# Patient Record
Sex: Male | Born: 1990 | Race: Black or African American | Hispanic: No | Marital: Single | Smoking: Never smoker
Health system: Southern US, Community
[De-identification: ages and names within clinical notes are randomized; demographics above are authoritative.]

## PROBLEM LIST (undated history)

## (undated) DIAGNOSIS — K219 Gastro-esophageal reflux disease without esophagitis: Secondary | ICD-10-CM

## (undated) DIAGNOSIS — D649 Anemia, unspecified: Secondary | ICD-10-CM

## (undated) HISTORY — PX: OTHER SURGICAL HISTORY: SHX169

## (undated) HISTORY — DX: Gastro-esophageal reflux disease without esophagitis: K21.9

## (undated) HISTORY — DX: Anemia, unspecified: D64.9

## (undated) HISTORY — PX: THROAT SURGERY: SHX803

---
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
## 2009-03-01 ENCOUNTER — Ambulatory Visit (HOSPITAL_COMMUNITY): Admission: RE | Admit: 2009-03-01 | Discharge: 2009-03-01 | Payer: Self-pay | Admitting: Family Medicine

## 2010-08-26 NOTE — H&P (Signed)
Derrick Lewis, [name] NO.:  [id number]   MEDICAL RECORD NO.:  [id number]                   PATIENT TYPE:  INP   LOCATION:  0203                                 FACILITY:  [facility]   PHYSICIAN:  Carolanne Grumbling, M.D.                 DATE OF BIRTH:  1991/03/24   DATE OF ADMISSION:  01/29/2003  DATE OF DISCHARGE:                         PSYCHIATRIC ADMISSION ASSESSMENT   CHIEF COMPLAINT:  Efstathios was just discharged from this hospital two days  prior to the readmit.  He had continued the same behaviors on this  admission, banging his head on his desk, threatening to stab himself at  school, running away from the school after he threatened to kill himself.   PATIENT IDENTIFICATION:  Derrick Lewis is an almost 20 year old boy.   HISTORY OF PRESENT ILLNESS:  [name] basically does not give any reasons for  his being so depressed.  He shrugs, does not know why, he just feels  depressed and suicidal.  His aunt reported that he has been depressed since  his grandmother died a year ago.  She was the person who reared him.  His  mother has a serious drug problem.  After the grandmother died, he has been  living with his aunt but he has remained depressed and quite disturbed.   PAST PSYCHIATRIC HISTORY:  He was just discharged from this hospital two  days prior to the readmit.  He sees Nawar M. Alnaquib, M.D., outpatient and  [facility] for outpatient therapy.   SUBSTANCE ABUSE HISTORY:  Drug, alcohol, and legal issues: None were  reported.   PAST MEDICAL HISTORY:  He has gastroesophageal reflux disorder.   MEDICATIONS:  1. Zoloft.  2. Risperdal.  3. Prevacid.   ALLERGIES:  He has no known allergies to medicines.   FAMILY, SCHOOL, AND SOCIAL ISSUES:  He was reared by his grandmother who  died a year ago.  His aunt is now his guardian.  His mother has serious drug  problems.  His father is apparently out of the picture.  No abuse was  reported, according to  Skylier's story.  He is in the seventh grade.  He did  not want to leave the hospital the last time.  He reportedly feels  comfortable in the hospital but he was encouraged to leave rather than  become increasingly dependent on the hospital.   MENTAL STATUS EXAM:  Mental status at the time of the initial evaluation  revealed an alert, oriented young man who came to the interview willingly  and was cooperative.  He was appropriately dressed and groomed.  He looked  sad and said he felt depressed.  He admitted to suicidal thoughts and  threats and still had suicidal thoughts at the time of admission.  There was  no evidence of any thought disorder or other psychosis.  Short and long-term  memory were intact as measured by  his ability to recall recent and remote  events in his own life.  Insight was lacking.  Intellectual functioning  seemed at least average.  Concentration was adequate for a one-to-one  interview.   ADMISSION DIAGNOSES:   AXIS I:  Major depressive disorder, recurrent, moderate.   AXIS II:  Deferred.   AXIS III:  Gastroesophageal reflux disease.   AXIS IV:  Severe.   AXIS V:  45/65   ASSETS AND STRENGTHS:  [name] is cooperative.   INITIAL PLAN OF CARE:  The plan is to return him home once again with the  hopes that he is taking some responsibility for his behavior and once again  denying any suicidal thoughts.  Medications will be continued.  Beverly Milch, M.D., will be the attending.   ESTIMATED LENGTH OF HOSPITALIZATION:  About five days.                                               Carolanne Grumbling, M.D.    GT/MEDQ  D:  01/30/2003  T:  01/30/2003  Job:  [id number]

## 2010-08-26 NOTE — Discharge Summary (Signed)
Derrick Lewis, FERRALL                           ACCOUNT NO.:  [id number]   MEDICAL RECORD NO.:  [id number]                   PATIENT TYPE:  INP   LOCATION:  0203                                 FACILITY:  [facility]   PHYSICIAN:  Beverly Milch, MD                  DATE OF BIRTH:  08-Apr-1991   DATE OF ADMISSION:  01/29/2003  DATE OF DISCHARGE:  02/04/2003                                 DISCHARGE SUMMARY   IDENTIFYING DATA:  A 97[age]47/20-year-old male, 7th grade student at [employer], was readmitted by Dr. Carolanne Grumbling two days after discharge  from week-long inpatient treatment for head banging, threatening to stab  himself at school, running away from school, and relative emotional  breakdown.  For full details, please see the typed admission assessment.   SYNOPSIS OF PRESENT ILLNESS:  The patient had been a reasonably good student  and behaviorally-controlled child until the last year or so.  Following the  death of his grandmother and the abandonment by his mother who was using  crack at the time of grandmother's funeral, the patient has become more  angry and dysphorically refusing of communication.  Aunt has been attempting  to help regulate his behavior, as well as to provide support and love.  The  patient has been in therapy with [facility] worker [name], who works on the  grief over grandmother's death.  Dr. [name] has been prescribing  pharmacotherapy for the patient initially with Zoloft 50 mg daily and  Risperdal 1 mg at bedtime.  During his last hospitalization, the patient's  Risperdal was increased to 2 mg nightly and Zoloft to 150 mg every morning.  The aunt wondered if the patient's head banging was due to the Risperdal and  [name] seemed to wonder the same.  Despite phone interventions, the patient  continued to escalate in his behavior and had predicated at the time of  hospital discharge that he would get in trouble though he had resolved his  statements that he  would hurt himself.  These had apparently re-emerged.  The patient also takes Prevacid for gastroesophageal reflux.  Father is out  of the picture.  The patient does not want to return to school but does find  that his interaction at school, as well as with his aunt, are now mobilizing  verbal processing of his loss and despair.  This is his only area of  progress thus far.   INITIAL MENTAL STATUS EXAMINATION:  Dr. [name] noted the patient looked sad  and said he felt depressed.  He acknowledged suicide thoughts and threats.  He was cognitively intact and had no psychotic or manic symptoms.  He lacked  insight but concentration was adequate.   LABORATORY FINDINGS:  During his last hospitalization, the patient had an  elevated alkaline phosphatase at 522 with upper limit of normal 499, likely  due to  active growth.  His MCV was borderline low at 74.2 and hemoglobin at  11.3 with hematocrit 33.9.  His other studies during that hospitalization  were normal.  At the closure of the current hospitalization, comprehensive  metabolic panel was checked relative to the patient's ongoing  pharmacotherapy.  His basic metabolic panel was normal except glucose 105  with reference range 70-99.  His fasting glucose had been 85 and normal his  last hospitalization one week before.  His creatinine was normal at 0.7 and  calcium at 8.9.  Hepatic function panel was normal with SGOT 22 and SGPT of  15 with albumin borderline low at 3.4 with reference range 3.5-5.2.  The  patient had a free T4 which was normal at 0.92 with reference range 0.89-1.8  and his TSH had been normal at 1.118 last admission.  His T3 uptake was  normal at 36.7.  His a.m. cortisol was normal at 15.6.  These studies were  performed because the patient had significant elevation of blood pressure  and heart rate when he stood with blood pressure running upper limit of  normal in the standing position though normal supine.   HOSPITAL  COURSE AND TREATMENT:  General medical exam by Sallye Lat, P.A.-  C. noted no significant injury from head banging.  The patient reported in  his general medical exam that he did not want to be with his aunt.  He was  concluded to be healthy and had no medical consequences of head banging.  His vital signs on admission included weight of 112 pounds with blood  pressure 129/87 and heart rate of 94.  During his hospital stay, he had  supine blood pressures in the 110/70 range while standing blood pressures  were in the 130/84 range.  His maximum standing blood pressure was 137/89 at  which time his sitting blood pressure had been 106/58.  On the day of  discharge, his supine blood pressure was 101/63 with heart rate of 84 and  his standing blood pressure was 127/87 with heart rate of 123.  The  patient's medications were not further changed.  His Risperdal had been  returned to 1 mg at bedtime just prior to readmission by phone.  His Zoloft  was continued at 150 mg every morning.  I did discuss with the patient and  aunt at length the options for alternative pharmacotherapy considering that  he was not responding over time to Zoloft as rapidly as desired; however,  through the course of the hospital stay, the patient did begin opening up  more and showing more positive affect.  He began to accept that he would  need to be with his aunt and return to school but he expressed a strong  desire to spend more time with his maternal grandfather whom he thinks has  gotten over the death of the maternal grandmother.  He states that the  maternal grandfather drives a truck but he thinks that the aunt disapproves  of him.  The patient did work on how he might approach this need with the  aunt and they had a family conference and therapy session immediately prior  to discharge.  The patient was still hesitant to open up and discuss all of his thoughts and feelings but he was doing better at this by the  time of  discharge.  He had no self-injury during the hospital stay.  The patient was  not requiring any p.r.n. medication for two days prior to  discharge though  he did require 5 mg of olanzapine on 02/02/03 as a p.r.n.  The patient was  upset after aunt's visit at that time but continued to work on resolution,  understanding, and compromise.  He was discharged in improved condition free  of suicide ideation and with a conviction that he was not going to get in  trouble this time.   FINAL DIAGNOSES:   AXIS I:  1. Major depression, single episode, moderate severity with melancholic     features.  2. Dysthymic disorder, early onset, severe with atypical features.  3. Oppositional defiant disorder.  4. Parent/child problem.  5. Other specified family circumstances.  6. Other interpersonal problem.   AXIS II:  Diagnosis deferred.   AXIS III:  1. Gastroesophageal reflux.  2. Borderline microcytosis and hypoalbuminemia.  3. Single episode of borderline elevated glucose.  4. Orthostatic borderline elevation of blood pressure and heart rate.   AXIS IV:  Stressors:  Family--severe, predominately acute and chronic;  school phase of life--moderate, predominately acute.   AXIS V:  GAF at the time of admission was 45 with highest in the last year  estimated at 72 and discharge GAF was 54.   PLAN:  The patient is discharged home on Zoloft 100-mg tablets using 1-1/2  tablets or 150 mg every morning having a supply at home.  His Risperdal is  back at 1 mg every bedtime and he has a supply at home.  He continues  Prevacid every morning for reflux though he was using Protonix 40 mg every  morning in the hospital for formulary reasons.  The patient will have  individual and family therapy with Jolly Mango 02/09/03 at 1530 at [facility].  He will see Dr. [name] 02/24/03 at 1515.  He has  Mercie Eon at [employer] as a Theatre stage manager for grief.  He hopes to spend more  time with  his maternal grandfather if aunt will allow and approve.  Crisis  and safety  plans are established if needed and they understand side effects, risks, and  proper use of medications including FDA scientific conclusions about all  antidepressants having a 1 in 50 chance of producing suicide-related side  effects in children less than 18.                                               Beverly Milch, MD    GJ/MEDQ  D:  02/05/2003  T:  02/05/2003  Job:  [id number]   cc:   Jolly Mango  [facility]  [address] [place] [address]   Laney Pastor. Alnaquib, M.D.  Fax: [phone]

## 2010-08-26 NOTE — Discharge Summary (Signed)
NAMEGIAVONNI, Derrick Lewis                           ACCOUNT NO.:  [id number]   MEDICAL RECORD NO.:  [id number]                   PATIENT TYPE:  INP   LOCATION:  0605                                 FACILITY:  [facility]   PHYSICIAN:  Derrick Milch, MD                  DATE OF BIRTH:  12/20/1990   DATE OF ADMISSION:  01/17/2003  DATE OF DISCHARGE:  01/27/2003                                 DISCHARGE SUMMARY   IDENTIFYING DATA:  This 20 year old male seventh grade student at  [employer] was admitted voluntarily from the Access Department at  [facility] where the patient and aunt presented as patient of  Derrick Lewis, M.D.  Despite low dose Risperdal and Zoloft  pharmacotherapy, the patient the patient has continued to become more  aggressive and resistant at school and home.  His guardian aunt brings him  for emergency intervention after the patient grabbed knives in the home  kitchen, threatening to harm himself after attempting to run away after aunt  intervened behaviorally with discipline for the patient's disruption of  school and decline in grades as she was called by the teacher.  For full  details, please see the typed history and physical.   HISTORY OF PRESENT ILLNESS:  The patient indicates that he is progressively  bored and unmotivated with school and remainder of life.  He has been  getting in more and more trouble and exhibiting resistive affective  aggression when confronted.  He experienced the death of his maternal  grandmother and the abandonment by his mother who was using crack at the  time of grandmother's funeral.  The aunt has been diligent in attempting to  care for the patient since grandmother died in 13-Sep-2001.  Mother is antisocial  as well as having drug dependence.  The patient is on Prevacid for  gastroesophageal reflux though he skips a dose sometimes as prescribed by  Derrick Lewis, M.D., at [facility].  The patient has a  sustained  pattern of dysphoria that has been difficult to assess intrapsychically as  the patient is acting out more and clamming up to others' attempts to talk  out solutions with him.  The patient has been more oppositional over the  last six months.  He has had no hypomanic symptoms.   INITIAL MENTAL STATUS EXAM:  Neurological exam was intact.  The patient had  moderate to severe dysphoria overtly initially, though he would not verbally  clarify or quantitate his intrapsychic symptoms.  He has intense neurotic  conflict and acts out behaviorally more than he will talk out problems or  solutions.  He is intellectually capable and has affective resources to work  in therapy.  He has a Radiographer, therapeutic, Clinical cytogeneticist, who has been helping with the  patient's grief over grandmother's death.  The patient has capacity for  insight and judgment  though he disengages these as he establishes mounting  conflicts with school and family.  He is capable of A and B school work and  rewarding relations in the past but is now disengaged.   LABORATORY FINDINGS:  Urinalysis revealed ketones of 40 and total protein 30  but was otherwise normal including 0-2 wbc's and 0-2 rbc's with specific  gravity 1.030.  This was felt to be representative of volume contraction  effects.  Hepatic function panel was normal except albumin borderline low at  3.4 with reference range 3.5-5.2.  AST was normal at 20 and ALT 10.  His  alkaline phosphatase was elevated at 522 with upper limit of normal 499  though this was felt to be associated with active growth.  Basic metabolic  panel was normal with sodium 142, potassium 3.8, glucose 85, creatinine 0.7,  and calcium 8.6.  CBC was normal with white count 7100, hemoglobin 11.3,  hematocrit 33.9, and platelet count 254,000, though MCV was borderline low  at 74.2 with reference range 78-92.  He did have 66% neutrophils, 22%  lymphocytes, and 22% monocytes.  His GGT was normal at 13  and TSH at 1.118.   [facility] COURSE AND TREATMENT:  General medical exam by Derrick Lewis,  P.A.-C., revealed no abnormalities.  The patient was generally healthy,  reporting having some sutures of the left eyebrow in the past and having no  medication allergies.  The patient reported having friends but not  understanding his schoolwork.  He noted that his 20 year old sister steals  and that he has conflict with his aunt.  Biological mother is a crack  addict.  The patient wrote and excellent letter to his mother clarifying his  anger over her coming in and out of his life and using drugs when  grandmother was dying.  He reviewed the promises mother makes him and then  recants as she uses all of the money for drugs.  He noted that he would not  stay with her unless she turned her life around.  He noted that she must  stop stealing and trying to burn people's houses down and running from the  police.  He did sign his letter with love.  The patient worked on all issues  during Lewis stay.  His hospitalization was extended by his lack of  opening up about his mood and the need to continue to attempt to interpret  for him ways to change and the benefits of doing so as well as the  consequences of not.  He reported at various times thoughts of harming  himself, auditory hallucinations, and angry refusal.  He banged his forehead  and his occiput on the wall at one time and hit his right knuckles on the  wall in anger over unit discipline for his breaking the rules.  He gradually  worked through such.  His termination work noted an exacerbation in these  symptoms again.  The patient seemed to be stating simultaneously that he was  ready for discharge but that he was angry and undermining and resisting  discharge.  We repeatedly gave him feedback on his failed coping attempts  and communication efforts.  We prompted successful communication and interventions, particularly on the day of  discharge.  His electrocardiogram  on the day of discharge was normal sinus rhythm, normal EKG with QTc 396  msec on 2 mg Risperdal the evening before.  His Risperdal was titrated up  during the Lewis stay as was  Zoloft.  His QRS was 82 msec and PR was 152  msec.  His rate was 72.  His vital signs remained normal throughout Lewis  stay.  Though he was taunting and threatening at times, even on the day of  discharge, he was able to work through over the two days preceding discharge  a desire to be with his aunt rather than to be at the [facility].  As he  dissipated his acting out, it was hoped that he would be able to return home  without acting out there.  This seemed to be successful in the discharge  proceedings.  The patient's admission weight was 107 pounds and blood  pressure was 131/82 supine and 139/91 standing with heart rate 83.  He  required p.r.n. Zyprexa to sleep the evening prior to discharge despite the  increase in his Risperdal.  He still stated that he awoke at 4:30 or 5:00 in  the morning.  However, he was not felt to have major depression but rather,  dysthymia and oppositional defiant disorder.  His final blood pressure was  127/89 with heart rate 93 on the day prior to discharge and diastolic was 90  with standing but 72 with sitting on the day of discharge.  The patient  concluded that the way he would hurt himself if he did after discharge was  to get in trouble again.  He openly dissipated this in the termination work,  particularly on the day of discharge.  He was tolerating his medications  well at the time of discharge and had no medication-associated or induced  side effects including no suicidal ideation from medication.  He was  discharged in improved condition to his aunt's.   FINAL DIAGNOSES:   AXIS I:  1. Dysthymic disorder, early onset, severe with atypical features.  2. Oppositional defiant disorder.  3. Rule out major depression, single episode,  moderate severity with     melancholic features (provisional diagnosis).  4. Parent-child problem.  5. Other specified family circumstances.  6. Other interpersonal problems.   AXIS II:  Diagnosis deferred.   AXIS III:  1. Gastroesophageal reflux disease.  2. Borderline elevated blood pressure intermittently.  3. Urine ketones and protein likely associated with volume contraction.  4. Borderline microcytosis and hypoalbuminemia.  5. Elevated alkaline phosphatase likely associated with growth.   AXIS IV:  Stressors: Family- severe, predominantly acute and chronic; school  and phase of life- moderate, predominantly acute.   AXIS V:  Global assessment of functioning at the time of admission was 38  with highest in the last year estimated at 72 and global assessment of  functioning at the time of discharge 54.   PLAN:  The patient was discharged on the following medications:  1. Zoloft 100 mg tablet to use one and a half tablets every morning,    quantity #45 with no refill prescribed.  2. Risperdal 2 mg tablet to use one every bedtime, quantity #30 with no     refill prescribed.  3. Prevacid to continue one every morning for reflux, having a supply at     home though in the Lewis we used Protonix 40 mg as the formulary did     not include Prevacid.   The patient will have therapy with Mercie Eon in seven to 10 days with  aunt having to call to schedule.  The patient will see Derrick Lewis,  M.D., February 12, 2003, at 10:15 for medication check.  Apparently, Kriste Basque  is  his [facility] therapist rather than Peggy as I misunderstood the patient's  description of Becky's name.  Diagnoses and medications were explained early  on to aunt who participated in family therapy effectively including in  discharge proceedings.  Crisis and safety plans are formulated if needed.                                               Derrick Milch, MD    GJ/MEDQ  D:  01/27/2003  T:   01/28/2003  Job:  [id number]   cc:   Mercie Eon  [facility]  [address]  [place], [place] [address]   Laney Pastor. Lewis, M.D.  Fax: [phone]
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
## 2012-10-08 ENCOUNTER — Ambulatory Visit: Payer: Self-pay | Admitting: Emergency Medicine

## 2012-10-08 VITALS — BP 100/70 | HR 69 | Temp 98.0°F | Resp 16 | Ht 69.5 in | Wt 145.0 lb

## 2012-10-08 DIAGNOSIS — Z0289 Encounter for other administrative examinations: Secondary | ICD-10-CM

## 2012-10-08 NOTE — Progress Notes (Signed)
[facility] [address], [place] [address] [phone]  Date:  10/08/2012   Name:  Derrick Lewis   DOB:  08/12/1990   MRN:  [id number]  PCP:  No PCP Per Patient    Chief Complaint: Employment Physical   History of Present Illness:  Derrick Lewis is a 22 y.o. very pleasant male patient who presents with the following:  DOT certification  There are no active problems to display for this patient.   History reviewed. No pertinent past medical history.  History reviewed. No pertinent past surgical history.  History  Substance Use Topics  . Smoking status: Never Smoker   . Smokeless tobacco: Not on file  . Alcohol Use: No    History reviewed. No pertinent family history.  No Known Allergies  Medication list has been reviewed and updated.  No current outpatient prescriptions on file prior to visit.   No current facility-administered medications on file prior to visit.    Review of Systems:  As per HPI, otherwise negative.    Physical Examination: Filed Vitals:   10/08/12 1707  BP: 100/70  Pulse: 69  Temp: 98 F (36.7 C)  Resp: 16   Filed Vitals:   10/08/12 1707  Height: 5' 9.5" (1.765 m)  Weight: 145 lb (65.772 kg)   Body mass index is 21.11 kg/(m^2). Ideal Body Weight: Weight in (lb) to have BMI = 25: 171.4  GEN: WDWN, NAD, Non-toxic, A & O x 3 HEENT: Atraumatic, Normocephalic. Neck supple. No masses, No LAD. Ears and Nose: No external deformity. CV: RRR, No M/G/R. No JVD. No thrill. No extra heart sounds. PULM: CTA B, no wheezes, crackles, rhonchi. No retractions. No resp. distress. No accessory muscle use. ABD: S, NT, ND, +BS. No rebound. No HSM. EXTR: No c/c/e NEURO Normal gait.  PSYCH: Normally interactive. Conversant. Not depressed or anxious appearing.  Calm demeanor.    Assessment and Plan: DOT certification Signed,  Phillips Odor, MD

## 2012-10-24 ENCOUNTER — Encounter: Payer: Self-pay | Admitting: Emergency Medicine

## 2014-03-10 ENCOUNTER — Ambulatory Visit (INDEPENDENT_AMBULATORY_CARE_PROVIDER_SITE_OTHER): Payer: Self-pay | Admitting: Physician Assistant

## 2014-03-10 VITALS — BP 102/76 | HR 72 | Temp 98.0°F | Resp 16 | Ht 69.5 in | Wt 153.4 lb

## 2014-03-10 DIAGNOSIS — J029 Acute pharyngitis, unspecified: Secondary | ICD-10-CM

## 2014-03-10 DIAGNOSIS — R0981 Nasal congestion: Secondary | ICD-10-CM

## 2014-03-10 DIAGNOSIS — R05 Cough: Secondary | ICD-10-CM

## 2014-03-10 DIAGNOSIS — J069 Acute upper respiratory infection, unspecified: Secondary | ICD-10-CM

## 2014-03-10 DIAGNOSIS — R059 Cough, unspecified: Secondary | ICD-10-CM

## 2014-03-10 MED ORDER — BENZONATATE 100 MG PO CAPS: 100.0000 mg | ORAL_CAPSULE | Freq: Three times a day (TID) | ORAL | Status: DC | PRN

## 2014-03-10 MED ORDER — GUAIFENESIN ER 1200 MG PO TB12: 1.0000 | ORAL_TABLET | Freq: Two times a day (BID) | ORAL | Status: DC | PRN

## 2014-03-10 MED ORDER — HYDROCODONE-HOMATROPINE 5-1.5 MG/5ML PO SYRP: 5.0000 mL | ORAL_SOLUTION | Freq: Three times a day (TID) | ORAL | Status: DC | PRN

## 2014-03-10 MED ORDER — HYDROCOD POLST-CHLORPHEN POLST 10-8 MG/5ML PO LQCR: 5.0000 mL | Freq: Every evening | ORAL | Status: DC | PRN

## 2014-03-10 MED ORDER — IPRATROPIUM BROMIDE 0.03 % NA SOLN: 2.0000 | Freq: Two times a day (BID) | NASAL | Status: DC

## 2014-03-10 NOTE — Progress Notes (Signed)
    MRN: [id number] DOB: 07/23/1990  Subjective:   Derrick Lewis is a 23 y.o. male presenting for 6 day history of sore throat. Used alka-seltzer with relief. The following day patient started having dry cough intermittently productive with yellow sputum, chest pain with coughing worse at night affecting his sleep, worsening sinus congestion. Trying to drink fluids and rest. Has also been using Mucinex with minimal relief. Denies fevers, sinus pain, rhinorrhea, ear pain, ear drainage, itchy watery eyes, shob, wheezing, abdominal pain, n/v, diarrhea. Denies seasonal allergies, history of asthma, reflux. Has had 1 sick contact, 24 year old daughter a week and a half ago. Patient is also a [employer] architecttruck driver, [place]. Denies smoking, or alcohol use. Denies any other aggravating or relieving factors, no other questions or concerns.  No current medications.  No Known Allergies.  Denies past medical history.  Denies past surgical history.   ROS As in subjective.   Objective:   Vitals: BP 102/76 mmHg  Pulse 72  Temp(Src) 98 F (36.7 C) (Oral)  Resp 16  Ht 5' 9.5" (1.765 m)  Wt 153 lb 6.4 oz (69.582 kg)  BMI 22.34 kg/m2  SpO2 98%  Physical Exam  Constitutional: He is oriented to person, place, and time and well-developed, well-nourished, and in no distress.  HENT:  TM's flat bilaterally, intact, non-tender, no effusions or erythema. Nasal turbinates inflamed, clear rhinorrhea. Postnasal drip present, without oropharyngeal exudates or tonsillar abscesses.  Eyes: Conjunctivae and EOM are normal. Right eye exhibits no discharge. Left eye exhibits no discharge. No scleral icterus.  Neck: Normal range of motion.  Cardiovascular: Normal rate, regular rhythm, normal heart sounds and intact distal pulses.  Exam reveals no gallop and no friction rub.   No murmur heard. Pulmonary/Chest: Effort normal and breath sounds normal. No stridor. No respiratory distress. He has no wheezes.  He has no rales. He exhibits no tenderness.  No dullness to percussion.  Abdominal: Soft. Bowel sounds are normal. There is no tenderness.  Lymphadenopathy:    He has no cervical adenopathy.  Neurological: He is alert and oriented to person, place, and time.  Skin: Skin is warm and dry. No rash noted. He is not diaphoretic. No erythema.   Assessment and Plan :   1. Upper respiratory infection - Likely viral in etiology, advised supportive care - return to clinic if symptoms worsen, fail to improve in the next 2-4 days, otherwise return to clinic as needed  2. Sore throat 3. Cough - Advised to stop cough syrup if he has drowsiness in the morning before driving - benzonatate (TESSALON) 100 MG capsule; Take 1-2 capsules (100-200 mg total) by mouth 3 (three) times daily as needed for cough.  Dispense: 40 capsule; Refill: 0 - HYDROcodone-homatropine (HYCODAN) 5-1.5 MG/5ML syrup; Take 5 mLs by mouth every 8 (eight) hours as needed for cough.  Dispense: 120 mL; Refill: 0  4. Nasal congestion - ipratropium (ATROVENT) 0.03 % nasal spray; Place 2 sprays into both nostrils 2 (two) times daily.  Dispense: 30 mL; Refill: 0 - Guaifenesin (MUCINEX MAXIMUM STRENGTH) 1200 MG TB12; Take 1 tablet (1,200 mg total) by mouth every 12 (twelve) hours as needed.  Dispense: 14 tablet; Refill: 1   Wallis BambergMario Derryck Shahan, PA-C [facility] [phone] 03/10/2014 10:34 AM

## 2014-03-10 NOTE — Progress Notes (Signed)
The patient was discussed with me and I agree with the diagnosis and treatment plan.  

## 2014-03-10 NOTE — Patient Instructions (Signed)
We will treat this as a respiratory viral syndrome. I recommend you rest, drink plenty of fluids, eat light meals including soups. You may use cough syrup for your cough and sore throat, just be aware that it can definitely make you drowsy and sleepy so do not drive or operate any heavy machinery. You may also use ibuprofen over-the-counter for your sore throat. Please let me know if you are not seeing any improvement or get worse in the next 2-4 days.    Upper Respiratory Infection, Adult An upper respiratory infection (URI) is also sometimes known as the common cold. The upper respiratory tract includes the nose, sinuses, throat, trachea, and bronchi. Bronchi are the airways leading to the lungs. Most people improve within 1 week, but symptoms can last up to 2 weeks. A residual cough may last even longer.  CAUSES Many different viruses can infect the tissues lining the upper respiratory tract. The tissues become irritated and inflamed and often become very moist. Mucus production is also common. A cold is contagious. You can easily spread the virus to others by oral contact. This includes kissing, sharing a glass, coughing, or sneezing. Touching your mouth or nose and then touching a surface, which is then touched by another person, can also spread the virus. SYMPTOMS  Symptoms typically develop 1 to 3 days after you come in contact with a cold virus. Symptoms vary from person to person. They may include:  Runny nose.  Sneezing.  Nasal congestion.  Sinus irritation.  Sore throat.  Loss of voice (laryngitis).  Cough.  Fatigue.  Muscle aches.  Loss of appetite.  Headache.  Low-grade fever. DIAGNOSIS  You might diagnose your own cold based on familiar symptoms, since most people get a cold 2 to 3 times a year. Your caregiver can confirm this based on your exam. Most importantly, your caregiver can check that your symptoms are not due to another disease such as strep throat,  sinusitis, pneumonia, asthma, or epiglottitis. Blood tests, throat tests, and X-rays are not necessary to diagnose a common cold, but they may sometimes be helpful in excluding other more serious diseases. Your caregiver will decide if any further tests are required. RISKS AND COMPLICATIONS  You may be at risk for a more severe case of the common cold if you smoke cigarettes, have chronic heart disease (such as heart failure) or lung disease (such as asthma), or if you have a weakened immune system. The very young and very old are also at risk for more serious infections. Bacterial sinusitis, middle ear infections, and bacterial pneumonia can complicate the common cold. The common cold can worsen asthma and chronic obstructive pulmonary disease (COPD). Sometimes, these complications can require emergency medical care and may be life-threatening. PREVENTION  The best way to protect against getting a cold is to practice good hygiene. Avoid oral or hand contact with people with cold symptoms. Wash your hands often if contact occurs. There is no clear evidence that vitamin C, vitamin E, echinacea, or exercise reduces the chance of developing a cold. However, it is always recommended to get plenty of rest and practice good nutrition. TREATMENT  Treatment is directed at relieving symptoms. There is no cure. Antibiotics are not effective, because the infection is caused by a virus, not by bacteria. Treatment may include:  Increased fluid intake. Sports drinks offer valuable electrolytes, sugars, and fluids.  Breathing heated mist or steam (vaporizer or shower).  Eating chicken soup or other clear broths, and maintaining good  nutrition.  Getting plenty of rest.  Using gargles or lozenges for comfort.  Controlling fevers with ibuprofen or acetaminophen as directed by your caregiver.  Increasing usage of your inhaler if you have asthma. Zinc gel and zinc lozenges, taken in the first 24 hours of the common  cold, can shorten the duration and lessen the severity of symptoms. Pain medicines may help with fever, muscle aches, and throat pain. A variety of non-prescription medicines are available to treat congestion and runny nose. Your caregiver can make recommendations and may suggest nasal or lung inhalers for other symptoms.  HOME CARE INSTRUCTIONS   Only take over-the-counter or prescription medicines for pain, discomfort, or fever as directed by your caregiver.  Use a warm mist humidifier or inhale steam from a shower to increase air moisture. This may keep secretions moist and make it easier to breathe.  Drink enough water and fluids to keep your urine clear or pale yellow.  Rest as needed.  Return to work when your temperature has returned to normal or as your caregiver advises. You may need to stay home longer to avoid infecting others. You can also use a face mask and careful hand washing to prevent spread of the virus. SEEK MEDICAL CARE IF:   After the first few days, you feel you are getting worse rather than better.  You need your caregiver's advice about medicines to control symptoms.  You develop chills, worsening shortness of breath, or brown or red sputum. These may be signs of pneumonia.  You develop yellow or brown nasal discharge or pain in the face, especially when you bend forward. These may be signs of sinusitis.  You develop a fever, swollen neck glands, pain with swallowing, or white areas in the back of your throat. These may be signs of strep throat. SEEK IMMEDIATE MEDICAL CARE IF:   You have a fever.  You develop severe or persistent headache, ear pain, sinus pain, or chest pain.  You develop wheezing, a prolonged cough, cough up blood, or have a change in your usual mucus (if you have chronic lung disease).  You develop sore muscles or a stiff neck. Document Released: 09/20/2000 Document Revised: 06/19/2011 Document Reviewed: 07/02/2013 [facility] Patient  Information 2015 [employer] [place]. This information is not intended to replace advice given to you by your health care provider. Make sure you discuss any questions you have with your health care provider.

## 2016-03-28 ENCOUNTER — Ambulatory Visit (INDEPENDENT_AMBULATORY_CARE_PROVIDER_SITE_OTHER): Payer: Self-pay | Admitting: Urgent Care

## 2016-03-28 VITALS — BP 110/68 | HR 64 | Temp 98.5°F | Resp 18 | Ht 69.5 in | Wt 157.0 lb

## 2016-03-28 DIAGNOSIS — Z8781 Personal history of (healed) traumatic fracture: Secondary | ICD-10-CM

## 2016-03-28 DIAGNOSIS — Z024 Encounter for examination for driving license: Secondary | ICD-10-CM

## 2016-03-28 NOTE — Progress Notes (Signed)
Commercial Driver Medical Examination   Derrick Lewis is a 25 y.o. male who presents today for a DOT physical exam. The patient reports history of femur fracture 2005, without sequelae. Denies smoking cigarettes or drinking alcohol. Denies dizziness, chronic headache, blurred vision, chest pain, shortness of breath, heart racing, palpitations, nausea, vomiting, abdominal pain, hematuria, lower leg swelling.   The following portions of the patient's history were reviewed and updated as appropriate: allergies, current medications, past family history, past medical history, past social history and past surgical history.  Objective:   BP 110/68 (BP Location: Right Arm, Patient Position: Sitting, Cuff Size: Small)   Pulse 64   Temp 98.5 F (36.9 C) (Oral)   Resp 18   Ht 5' 9.5" (1.765 m)   Wt 157 lb (71.2 kg)   SpO2 100%   BMI 22.85 kg/m   Vision/hearing:  Visual Acuity Screening   Right eye Left eye Both eyes  Without correction: 20/15 20/15 20/15   With correction:     Comments: Titmus test was 85 degrees in both eyes  Hearing Screening Comments: Whisper test was 7410ft in both ears  Patient can recognize and distinguish among traffic control signals and devices showing standard red, green, and amber colors.  Corrective lenses required: No  Monocular Vision?: No  Hearing aid requirement: No  Physical Exam  Constitutional: He is oriented to person, place, and time. He appears well-developed and well-nourished.  HENT:  TM's intact bilaterally, no effusions or erythema. Nasal turbinates pink and moist, nasal passages patent. No sinus tenderness. Oropharynx clear, mucous membranes moist, dentition in good repair.  Eyes: Conjunctivae and EOM are normal. Pupils are equal, round, and reactive to light. Right eye exhibits no discharge. Left eye exhibits no discharge. No scleral icterus.  Neck: Normal range of motion. Neck supple. No thyromegaly present.  Cardiovascular: Normal rate,  regular rhythm and intact distal pulses.  Exam reveals no gallop and no friction rub.   No murmur heard. Pulmonary/Chest: No stridor. No respiratory distress. He has no wheezes. He has no rales.  Abdominal: Soft. Bowel sounds are normal. He exhibits no distension and no mass. There is no tenderness.  Musculoskeletal: Normal range of motion. He exhibits no edema or tenderness.  Lymphadenopathy:    He has no cervical adenopathy.  Neurological: He is alert and oriented to person, place, and time. He has normal reflexes.  Skin: Skin is warm and dry. No rash noted. No erythema. No pallor.  Psychiatric: He has a normal mood and affect.   Labs: Comments: sp.[id number],blood-neg,sugar-neg,pro-neg  Assessment:    Healthy male exam.  Meets standards in 5049 CFR 391.41;  qualifies for 2 year certificate.    Plan:   Medical examiners certificate completed and printed. Return as needed.

## 2016-03-28 NOTE — Patient Instructions (Addendum)

## 2019-01-21 ENCOUNTER — Other Ambulatory Visit: Payer: Self-pay | Admitting: Otolaryngology

## 2019-01-21 DIAGNOSIS — J387 Other diseases of larynx: Secondary | ICD-10-CM

## 2019-01-24 ENCOUNTER — Ambulatory Visit
Admission: RE | Admit: 2019-01-24 | Discharge: 2019-01-24 | Disposition: A | Discharge status: Home / Self Care | Payer: 59 | Source: Ambulatory Visit | Attending: Otolaryngology | Admitting: Otolaryngology

## 2019-01-24 DIAGNOSIS — J387 Other diseases of larynx: Secondary | ICD-10-CM

## 2019-01-24 MED ORDER — IOPAMIDOL (ISOVUE-300) INJECTION 61%
75.0000 mL | Freq: Once | INTRAVENOUS | Status: AC | PRN
  Administered 2019-01-24: 75 mL via INTRAVENOUS

## 2019-02-19 ENCOUNTER — Other Ambulatory Visit: Payer: Self-pay

## 2019-02-19 DIAGNOSIS — Z20822 Contact with and (suspected) exposure to covid-19: Secondary | ICD-10-CM

## 2019-02-20 ENCOUNTER — Ambulatory Visit (INDEPENDENT_AMBULATORY_CARE_PROVIDER_SITE_OTHER): Payer: 59 | Admitting: Otolaryngology

## 2019-02-22 LAB — NOVEL CORONAVIRUS, NAA: SARS-CoV-2, NAA: NOT DETECTED

## 2019-12-03 ENCOUNTER — Encounter: Payer: Self-pay | Admitting: Gastroenterology

## 2019-12-03 ENCOUNTER — Ambulatory Visit (INDEPENDENT_AMBULATORY_CARE_PROVIDER_SITE_OTHER): Payer: No Typology Code available for payment source | Admitting: Gastroenterology

## 2019-12-03 VITALS — BP 94/70 | HR 68 | Ht 70.0 in | Wt 154.0 lb

## 2019-12-03 DIAGNOSIS — R6889 Other general symptoms and signs: Secondary | ICD-10-CM

## 2019-12-03 DIAGNOSIS — R0989 Other specified symptoms and signs involving the circulatory and respiratory systems: Secondary | ICD-10-CM

## 2019-12-03 NOTE — Patient Instructions (Signed)
If you are age 30 or older, your body mass index should be between 23-30. Your Body mass index is 22.1 kg/m. If this is out of the aforementioned range listed, please consider follow up with your Primary Care Provider.  If you are age 11 or younger, your body mass index should be between 19-25. Your Body mass index is 22.1 kg/m. If this is out of the aformentioned range listed, please consider follow up with your Primary Care Provider.   Please purchase the following medications over the counter and take as directed:  START: pepcid 20mg  one tablet at bedtime.  Please taper protonix over the next 2 - 3 weeks.  Please follow up with ENT.  Thank you for entrusting me with your care and choosing [facility].  Dr [facility]

## 2019-12-03 NOTE — Progress Notes (Signed)
HPI: This is a  Very pleasant 29 yo man  who was referred to me by [facility] Dr. Jeani Sow  The length the referral packet from the [facility] stated he was being sent here for anemia, unspecified.  He tells me that 1 month after his initial blood count was checked it was rechecked and it was normal.  I do not have any of those results but I believe that what he is telling me is accurate.  He explained that his biggest issue is that of throat clearing.  It has been a chronic problem.  I see extensive ENT notes from 2020 about this throat clearing problem.  Several people have told him that his throat clearing is acid related.  He has never had pyrosis, he has no dysphagia.  Currently he has been on proton pump inhibitor twice daily and his throat clearing is no different.  He has been on that for months.   His weight is overall stable.  He has no overt GI bleeding.  No abdominal pains.  Old Data Reviewed: I reviewed a 21 page packet from the [place] in [place].  There is very very little clinical information in the packet..  There is 1 page that says he has a provisional diagnosis "anemia, unspecified" there is another sentence several pages later that says "anemia, hemoglobin dropping.  Has GERD.  There is an occluded set of labs, these are dated June and March 2021, his hemoglobin was 11.6, his liver tests were all completely normal.  He was referred for "unspecified anemia".  I have a single data point of his blood work showing hemoglobin of 11.6.  I see no other anemia work-up.    I cannot tell exactly who from the [place] referred him here.  But I believe his primary care provider is Dr. Jeani Sow.  Through care everywhere I see he is well-established with an ENT physician who works through [facility].  He has leukoplakia of the vocal cords.  He has excessive mucus production.  I was able to view a note from about 1 year ago by his ENT physician.  His plan was for him to use Prilosec  twice daily before meals for a month and then call him with a progress report.  He was to get a repeat laryngoscopy 2 or 3 months from then.  I am not sure if he ever had that repeat laryngoscopy done.    Review of systems: Pertinent positive and negative review of systems were noted in the above HPI section. All other review negative.   Past Medical History:  Diagnosis Date  . Anemia   . GERD (gastroesophageal reflux disease)     Past Surgical History:  Procedure Laterality Date  . leg surgery    . THROAT SURGERY      Current Outpatient Medications  Medication Sig Dispense Refill  . pantoprazole (PROTONIX) 40 MG tablet Take 40 mg by mouth 2 (two) times daily.     No current facility-administered medications for this visit.    Allergies as of 12/03/2019  . (No Known Allergies)    Family History  Problem Relation Age of Onset  . Diabetes Mother   . Diabetes Maternal Grandmother     Social History   Socioeconomic History  . Marital status: Single    Spouse name: Not on file  . Number of children: Not on file  . Years of education: Not on file  . Highest education level:  Not on file  Occupational History  . Not on file  Tobacco Use  . Smoking status: Never Smoker  . Smokeless tobacco: Never Used  Vaping Use  . Vaping Use: Never used  Substance and Sexual Activity  . Alcohol use: Yes    Alcohol/week: 2.0 standard drinks    Types: 2 Cans of beer per week    Comment: 2 beers on the weekend  . Drug use: No  . Sexual activity: Yes  Other Topics Concern  . Not on file  Social History Narrative  . Not on file   Social Determinants of Health   Financial Resource Strain:   . Difficulty of Paying Living Expenses: Not on file  Food Insecurity:   . Worried About Programme researcher, broadcasting/film/video in the Last Year: Not on file  . Ran Out of Food in the Last Year: Not on file  Transportation Needs:   . Lack of Transportation (Medical): Not on file  . Lack of Transportation  (Non-Medical): Not on file  Physical Activity:   . Days of Exercise per Week: Not on file  . Minutes of Exercise per Session: Not on file  Stress:   . Feeling of Stress : Not on file  Social Connections:   . Frequency of Communication with Friends and Family: Not on file  . Frequency of Social Gatherings with Friends and Family: Not on file  . Attends Religious Services: Not on file  . Active Member of Clubs or Organizations: Not on file  . Attends Banker Meetings: Not on file  . Marital Status: Not on file  Intimate Partner Violence:   . Fear of Current or Ex-Partner: Not on file  . Emotionally Abused: Not on file  . Physically Abused: Not on file  . Sexually Abused: Not on file     Physical Exam: BP 94/70   Pulse 68   Ht 5\' 10"  (1.778 m)   Wt 154 lb (69.9 kg)   BMI 22.10 kg/m  Constitutional: generally well-appearing Psychiatric: alert and oriented x3 Eyes: extraocular movements intact Mouth: oral pharynx moist, no lesions Neck: supple no lymphadenopathy Cardiovascular: heart regular rate and rhythm Lungs: clear to auscultation bilaterally Abdomen: soft, nontender, nondistended, no obvious ascites, no peritoneal signs, normal bowel sounds Extremities: no lower extremity edema bilaterally Skin: no lesions on visible extremities   Assessment and plan: 29 y.o. male with resolved anemia, chronic throat clearing  He was a bit disheartened that he was referred here for anemia which has since resolved per his recollection of his labs at the [facility].  He tells me his biggest issue is chronic throat clearing and that is what we spent quite a bit of time talking about.  I do not think his throat clearing is at all related to his GI tract.  I do not think it is acid related.  He has no classic acid symptoms at all and his throat clearing has not improved at all since being on very strong twice daily proton pump inhibitor.  He has had some type of a nodule removed from his  vocal cords in 2020.  Last ENT note I see in care everywhere stated that he was to be seen back by the ENT and consider for repeat laryngoscopy.  He never had repeat laryngoscopy.  I think that is the next step.  He is on twice daily proton pump inhibitor and I recommended he taper this to completely off of proton pump inhibitors over 2  to 3 weeks.  Instead he is going to start taking a Pepcid over-the-counter 1 pill at bedtime every night.   Please see the "Patient Instructions" section for addition details about the plan.   Rob Bunting, MD [facility] 12/03/2019, 9:49 AM  Cc: No ref. provider found  Total time on date of encounter was 45  minutes (this included time spent preparing to see the patient reviewing records; obtaining and/or reviewing separately obtained history; performing a medically appropriate exam and/or evaluation; counseling and educating the patient and family if present; ordering medications, tests or procedures if applicable; and documenting clinical information in the health record).

## 2020-01-28 ENCOUNTER — Other Ambulatory Visit: Payer: Non-veteran care

## 2020-01-28 DIAGNOSIS — Z20822 Contact with and (suspected) exposure to covid-19: Secondary | ICD-10-CM

## 2020-01-28 NOTE — Progress Notes (Signed)
Novel

## 2020-01-29 LAB — SARS-COV-2, NAA 2 DAY TAT

## 2020-01-29 LAB — NOVEL CORONAVIRUS, NAA: SARS-CoV-2, NAA: NOT DETECTED

## 2021-10-04 IMAGING — CT CT NECK W/ CM
5 of 6 series · 14 of 33 positions shown, 16 images · IV contrast (iopamidol)
Comparison: 05/02/2008

CLINICAL DATA: Supraglottic lesion. Status post laryngoscopy and
biopsy of right anterior false vocal cord lesion with benign
pathology. Frequent throat clearing.

EXAM:
CT NECK WITH CONTRAST
TECHNIQUE: Multidetector CT imaging of the neck was performed using the
standard protocol following the bolus administration of intravenous
contrast.
CONTRAST:  75mL [id number] IOPAMIDOL ([id number]-YWW) INJECTION 61%

[Series 2: neck 2.00 br40 s3 st/ no angle · axial · 0.49mm/px · z∈[-786,-690]mm · 2 of 144 slices shown, 3 images]
[im 48/144  soft-tissue]
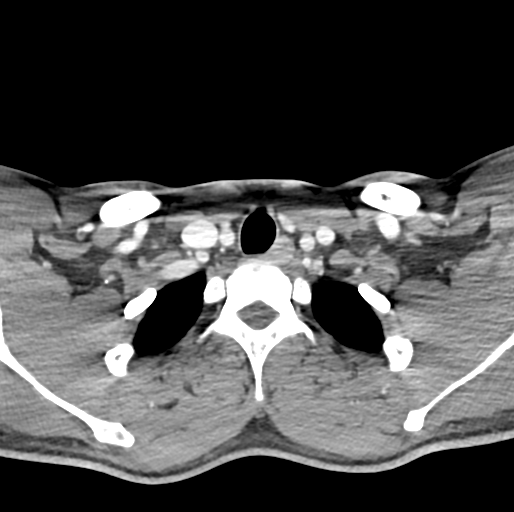
[im 48/144  bone]
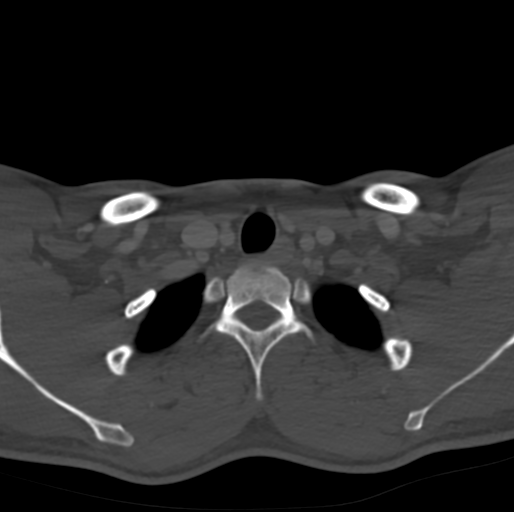
[im 96/144  bone]
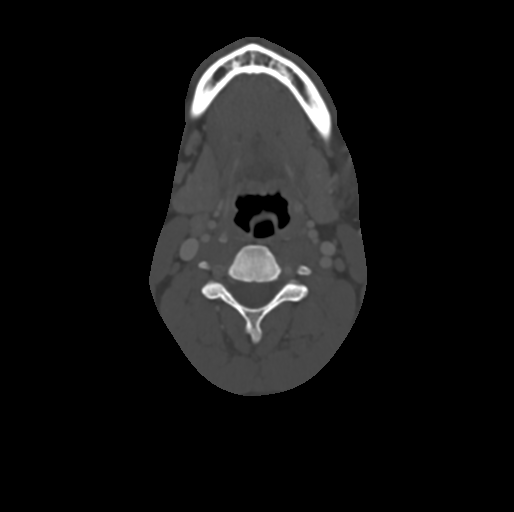

[Series 4: neck 2.00 br60 s3 bone/ no angle · axial · 0.49mm/px · z∈[-786,-690]mm · 2 of 144 slices shown]
[im 48/144  bone]
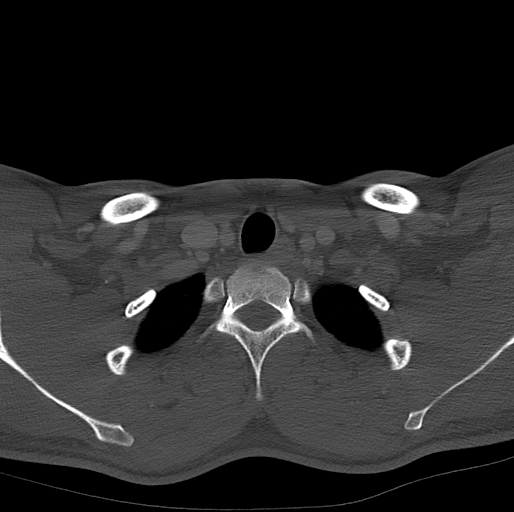
[im 96/144  bone]
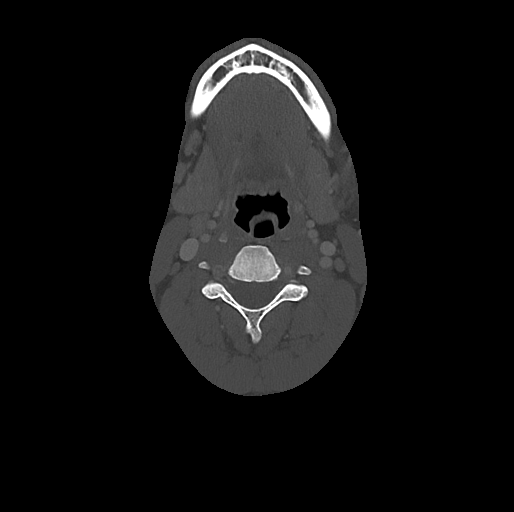

[Series 6: neck 2.00 br36 s3 angled axial (person_name) · axial · 0.49mm/px · z∈[-806,-712]mm · 2 of 144 slices shown]
[im 48/144  bone]
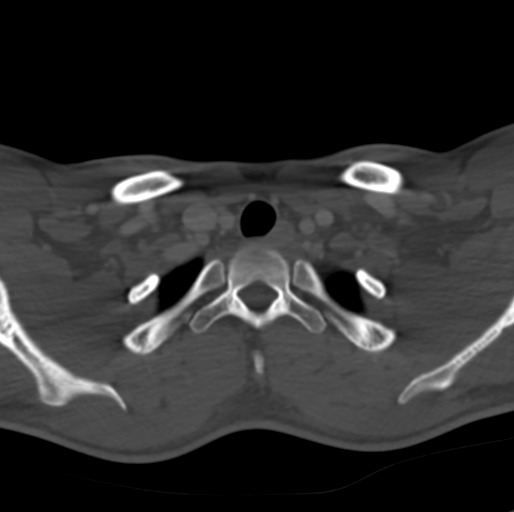
[im 96/144  bone]
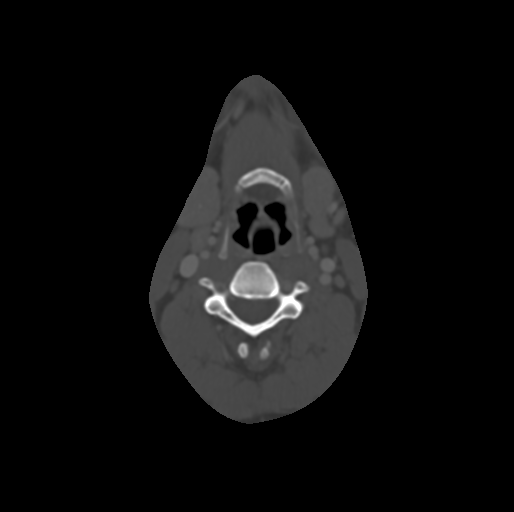

[Series 10: neck 2.00 br40 s3 (person_name) · coronal · 0.49mm/px · 3 of 141 slices shown (1 of 2)]
[im 29/141  bone]
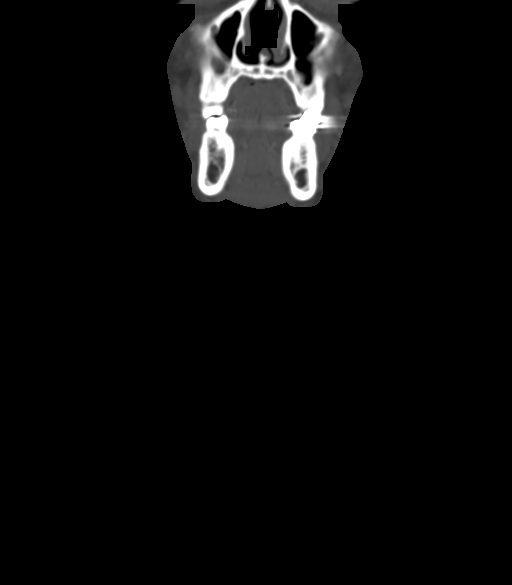
[im 57/141  bone]
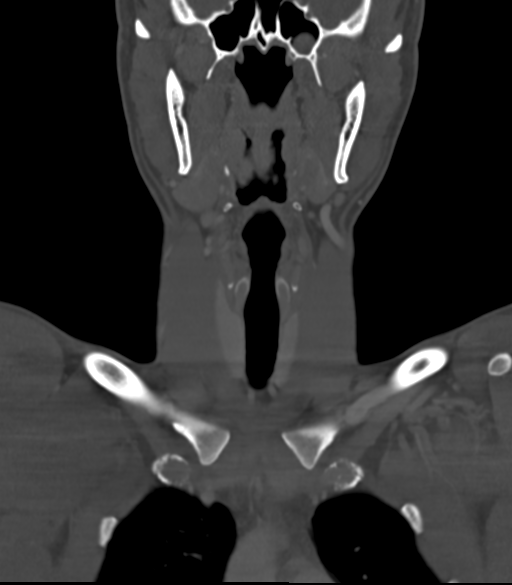
[im 85/141  bone]
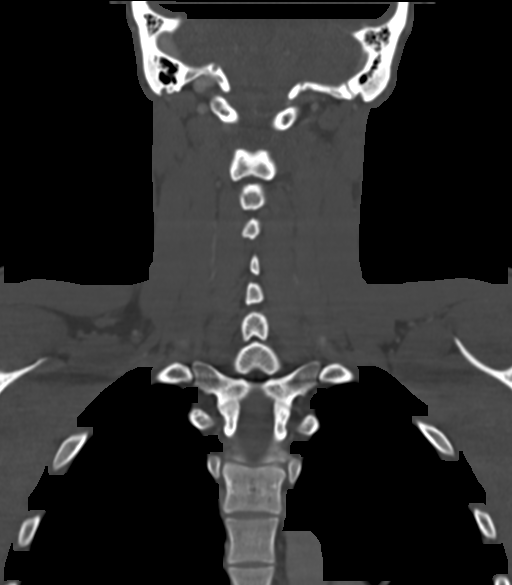

[Series 12: neck 2.00 br40 s3 (person_name) · sagittal · 0.49mm/px · 5 of 126 slices shown, 6 images (2 of 2)]
[im 42/126  bone]
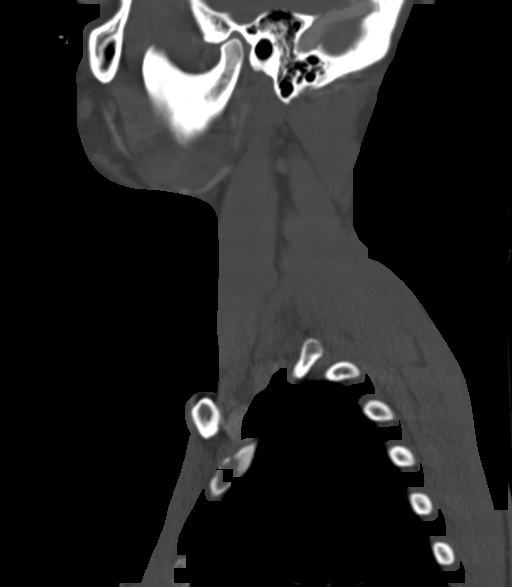
[im 53/126  bone]
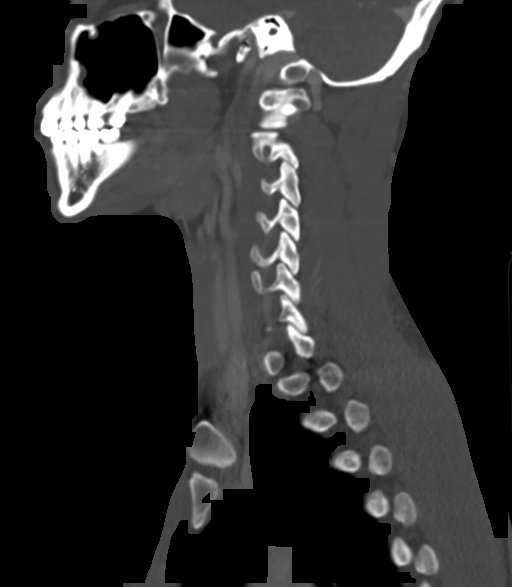
[im 63/126  soft-tissue]
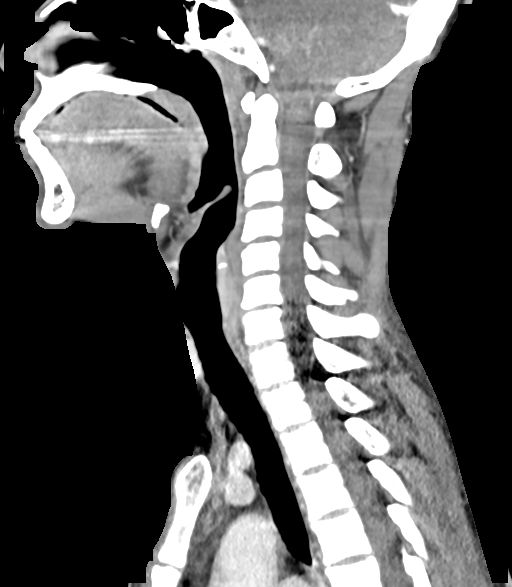
[im 63/126  bone]
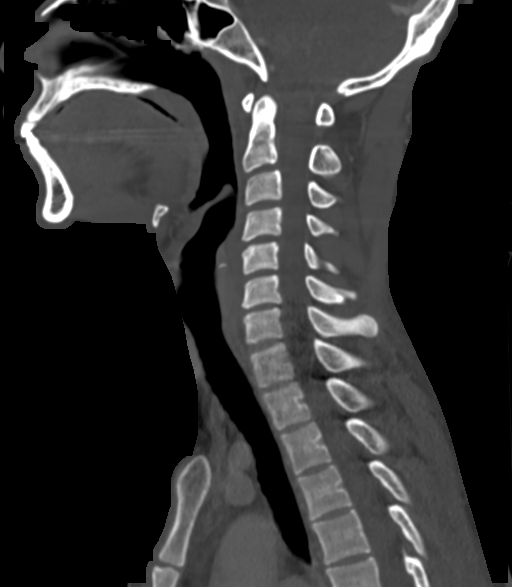
[im 73/126  bone]
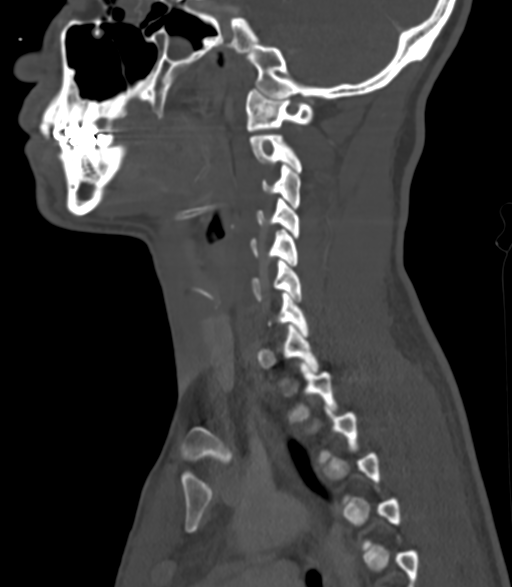
[im 84/126  bone]
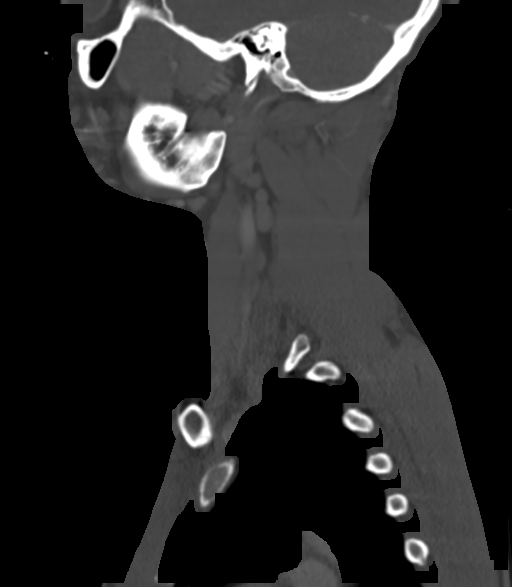

[14 of 33 positions shown; findings below may reference images not displayed]

FINDINGS: Pharynx and larynx: There is a slight asymmetric contour abnormality
of the larynx anteriorly on the right at the level of the false cord
with a 2 mm nodular focus noted (series 2, image 66). The larynx and
pharynx are otherwise unremarkable.

Salivary glands: No inflammation, mass, or stone.

Thyroid: Unremarkable.

Lymph nodes: No enlarged or suspicious lymph nodes in the neck.

Vascular: Major vascular structures of the neck are patent.
Hypoplastic right vertebral artery, a normal variant.

Limited intracranial: Unremarkable.

Visualized orbits: Unremarkable.

Mastoids and visualized paranasal sinuses: Moderate left ethmoid air
cell opacification. Clear mastoid air cells.

Skeleton: No acute osseous abnormality or suspicious osseous lesion.

Upper chest: Clear lung apices.

Other: None.
IMPRESSION: Subtle mucosal nodularity at the level of the right false vocal
cord, otherwise unremarkable neck CT.

## 2024-02-22 ENCOUNTER — Telehealth: Admitting: Physician Assistant

## 2024-02-22 DIAGNOSIS — R051 Acute cough: Secondary | ICD-10-CM

## 2024-02-22 MED ORDER — BENZONATATE 100 MG PO CAPS: 100.0000 mg | ORAL_CAPSULE | Freq: Three times a day (TID) | ORAL | 0 refills | Status: AC

## 2024-02-22 NOTE — Patient Instructions (Signed)
  Derrick Lewis, thank you for joining Laretha Luepke, PA-C for today's virtual visit.  While this provider is not your primary care provider (PCP), if your PCP is located in our provider database this encounter information will be shared with them immediately following your visit.   A [facility] MyChart account gives you access to today's visit and all your visits, tests, and labs performed at [facility]  click here if you don't have a [facility] MyChart account or go to [email or web address]  Consent: (Patient) Derrick Lewis provided verbal consent for this virtual visit at the beginning of the encounter.  Current Medications:  Current Outpatient Medications:    benzonatate  (TESSALON ) 100 MG capsule, Take 1 capsule (100 mg total) by mouth 3 (three) times daily., Disp: 21 capsule, Rfl: 0   pantoprazole (PROTONIX) 40 MG tablet, Take 40 mg by mouth 2 (two) times daily., Disp: , Rfl:    Medications ordered in this encounter:  Meds ordered this encounter  Medications   benzonatate  (TESSALON ) 100 MG capsule    Sig: Take 1 capsule (100 mg total) by mouth 3 (three) times daily.    Dispense:  21 capsule    Refill:  0    Supervising Provider:   BLAISE ALEENE KIDD [[id number]]     *If you need refills on other medications prior to your next appointment, please contact your pharmacy*  Follow-Up: Call back or seek an in-person evaluation if the symptoms worsen or if the condition fails to improve as anticipated.  [facility] [phone]  Other Instructions    If you have been instructed to have an in-person evaluation today at a local Urgent Care facility, please use the link below. It will take you to a list of all of our available [facility], including address, phone number and hours of operation. Please do not delay care.  [facility]  If you or a family member do not have a primary care provider, use the link below to schedule a  visit and establish care. When you choose a [facility] primary care physician or advanced practice provider, you gain a long-term partner in health. Find a Primary Care Provider  Learn more about [facility]'s in-office and virtual care options: [facility] - Get Care Now

## 2024-02-22 NOTE — Progress Notes (Signed)
 Virtual Visit Consent   Derrick Lewis, you are scheduled for a virtual visit with a [facility] provider today. Just as with appointments in the office, your consent must be obtained to participate. Your consent will be active for this visit and any virtual visit you may have with one of our providers in the next 365 days. If you have a MyChart account, a copy of this consent can be sent to you electronically.  As this is a virtual visit, video technology does not allow for your provider to perform a traditional examination. This may limit your provider's ability to fully assess your condition. If your provider identifies any concerns that need to be evaluated in person or the need to arrange testing (such as labs, EKG, etc.), we will make arrangements to do so. Although advances in technology are sophisticated, we cannot ensure that it will always work on either your end or our end. If the connection with a video visit is poor, the visit may have to be switched to a telephone visit. With either a video or telephone visit, we are not always able to ensure that we have a secure connection.  By engaging in this virtual visit, you consent to the provision of healthcare and authorize for your insurance to be billed (if applicable) for the services provided during this visit. Depending on your insurance coverage, you may receive a charge related to this service.  I need to obtain your verbal consent now. Are you willing to proceed with your visit today? Derrick Lewis has provided verbal consent on 02/22/2024 for a virtual visit (video or telephone). Derrick Ury, PA-C  Date: 02/22/2024 11:43 AM   Virtual Visit via Video Note   I, Derrick Lewis, connected with  Derrick Lewis  ([id number], March 29, 1991) on 02/22/24 at 11:30 AM EST by a video-enabled telemedicine application and verified that I am speaking with the correct person using two identifiers.  Location: Patient: Virtual Visit Location Patient:  Home Provider: Virtual Visit Location Provider: Home Office   I discussed the limitations of evaluation and management by telemedicine and the availability of in person appointments. The patient expressed understanding and agreed to proceed.    History of Present Illness: Derrick Lewis is a 33 y.o. who identifies as a male who was assigned male at birth, and is being seen today for congestion.  HPI: No significant past medical history presents for evaluation of cough ongoing for the last 3 days.  Patient reports cough is intermittently productive of thick, white mucus.  No other associated symptoms reported.  Reports wife currently has bronchitis.  Patient denies fevers, chills, nausea, vomiting, chest pain, shortness of breath, ear pain, sore throat.  He reports no concern for COVID or flu today.  Reports the cough is worse at nighttime, while he is working, he does work overnight.    Problems: There are no active problems to display for this patient.   Allergies: No Known Allergies Medications:  Current Outpatient Medications:    benzonatate  (TESSALON ) 100 MG capsule, Take 1 capsule (100 mg total) by mouth 3 (three) times daily., Disp: 21 capsule, Rfl: 0   pantoprazole (PROTONIX) 40 MG tablet, Take 40 mg by mouth 2 (two) times daily., Disp: , Rfl:   Observations/Objective: Patient is well-developed, well-nourished in no acute distress.  Resting comfortably  at home.  Head is normocephalic, atraumatic.  No labored breathing.  No nasal congestion noted Sinuses nontender to palpation per patient Patient ambulating in the  house without significant difficulty Speech is clear and coherent with logical content.  Patient is alert and oriented at baseline.   Assessment and Plan: 1. Acute cough (Primary) - benzonatate  (TESSALON ) 100 MG capsule; Take 1 capsule (100 mg total) by mouth 3 (three) times daily.  Dispense: 21 capsule; Refill: 0   Follow Up Instructions: I discussed the  assessment and treatment plan with the patient. The patient was provided an opportunity to ask questions and all were answered. The patient agreed with the plan and demonstrated an understanding of the instructions.  A copy of instructions were sent to the patient via MyChart unless otherwise noted below.    The patient was advised to call back or seek an in-person evaluation if the symptoms worsen or if the condition fails to improve as anticipated.    Derrick Thieme, PA-C
